# Patient Record
Sex: Male | Born: 2000 | Hispanic: Yes | Marital: Single | Smoking: Never smoker
Health system: Southern US, Community
[De-identification: ages and names within clinical notes are randomized; demographics above are authoritative.]

## PROBLEM LIST (undated history)

## (undated) ENCOUNTER — Ambulatory Visit: Admission: EM

---
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
## 2022-01-28 ENCOUNTER — Emergency Department
Admission: EM | Admit: 2022-01-28 | Discharge: 2022-01-28 | Disposition: A | Discharge status: Home / Self Care | Payer: Medicaid Other | Attending: Emergency Medicine | Admitting: Emergency Medicine

## 2022-01-28 ENCOUNTER — Other Ambulatory Visit: Payer: Self-pay

## 2022-01-28 ENCOUNTER — Emergency Department: Payer: Medicaid Other

## 2022-01-28 DIAGNOSIS — S6991XA Unspecified injury of right wrist, hand and finger(s), initial encounter: Secondary | ICD-10-CM | POA: Diagnosis not present

## 2022-01-28 DIAGNOSIS — R109 Unspecified abdominal pain: Secondary | ICD-10-CM | POA: Diagnosis not present

## 2022-01-28 DIAGNOSIS — W232XXA Caught, crushed, jammed or pinched between a moving and stationary object, initial encounter: Secondary | ICD-10-CM | POA: Diagnosis not present

## 2022-01-28 DIAGNOSIS — M79644 Pain in right finger(s): Secondary | ICD-10-CM | POA: Diagnosis present

## 2022-01-28 MED ORDER — MELOXICAM 15 MG PO TABS: 15.0000 mg | ORAL_TABLET | Freq: Every day | ORAL | 0 refills | Status: AC

## 2022-01-28 NOTE — ED Provider Notes (Signed)
[facility] Provider Note  Patient Contact: 9:34 PM (approximate)   History   Hand Injury   HPI  Philip Green is a 22 y.o. male who presents the emergency department complaining of abdominal pain.  Patient works at [employer], was putting a box on a conveyor belt when it bounced backwards striking his thumb.  Patient has been able to move the thumb appropriately, still continue about his daily activities.  Patient went back to work today and had an ongoing pain.  Patient presents for evaluation.  No open wounds.  No other injury or complaint.     Physical Exam   Triage Vital Signs: ED Triage Vitals  Enc Vitals Group     BP 01/28/22 2004 139/84     Pulse Rate 01/28/22 2004 79     Resp 01/28/22 2004 16     Temp 01/28/22 2004 98.6 F (37 C)     Temp Source 01/28/22 2004 Oral     SpO2 01/28/22 2004 100 %     Weight 01/28/22 2006 157 lb (71.2 kg)     Height 01/28/22 2006 5\' 10"  (1.778 m)     Head Circumference --      Peak Flow --      Pain Score 01/28/22 2006 3     Pain Loc --      Pain Edu? --      Excl. in [place]? --     Most recent vital signs: Vitals:   01/28/22 2004  BP: 139/84  Pulse: 79  Resp: 16  Temp: 98.6 F (37 C)  SpO2: 100%     General: Alert and in no acute distress.  Cardiovascular:  Good peripheral perfusion Respiratory: Normal respiratory effort without tachypnea or retractions. Lungs CTAB.  Musculoskeletal: Full range of motion to all extremities.  Visualization of the right hand reveals no open wounds.  Patient has good range of motion to the thumb.  Tender especially over the MCP joint.  No other palpable abnormality or tenderness.  Sensation capillary refill intact. Neurologic:  No gross focal neurologic deficits are appreciated.  Skin:   No rash noted Other:   ED Results / Procedures / Treatments   Labs (all labs ordered are listed, but only abnormal results are displayed) Labs Reviewed - No data to  display   EKG     RADIOLOGY  I personally viewed, evaluated, and interpreted these images as part of my medical decision making, as well as reviewing the written report by the radiologist.  ED Provider Interpretation: No acute traumatic finding on x-ray  DG Hand Complete Right  Result Date: 01/28/2022 CLINICAL DATA:  Status post trauma. EXAM: RIGHT HAND - COMPLETE 3+ VIEW COMPARISON:  None Available. FINDINGS: There is no evidence of fracture or dislocation. There is no evidence of arthropathy or other focal bone abnormality. Soft tissues are unremarkable. IMPRESSION: Negative. Electronically Signed   By: Virgina Norfolk M.D.   On: 01/28/2022 20:45    PROCEDURES:  Critical Care performed: No  Procedures   MEDICATIONS ORDERED IN ED: Medications - No data to display   IMPRESSION / MDM / [place] / ED COURSE  I reviewed the triage vital signs and the nursing notes.                                 Differential diagnosis includes, but is not limited to, contusion, jammed interphalangeal  digit, ligamentous rupture, de Quervain's tenosynovitis, fracture   Patient's presentation is most consistent with acute presentation with potential threat to life or bodily function.   Patient's diagnosis is consistent with jammed interphalangeal digit.  Patient presents emergency department complaining of thumb pain and bruising.  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  This made contact with his hand.  Patient still has full range of motion.  Negative x-ray.  Patient will have anti-inflammatories for symptom relief..  Patient is given ED precautions to return to the ED for any worsening or new symptoms.     FINAL CLINICAL IMPRESSION(S) / ED DIAGNOSES   Final diagnoses:  Jammed interphalangeal joint of finger of right hand, initial encounter     Rx / DC Orders   ED Discharge Orders          Ordered     meloxicam (MOBIC) 15 MG tablet  Daily        01/28/22 2154             Note:  This document was prepared using Dragon voice recognition software and may include unintentional dictation errors.   Brynda Peon 01/28/22 2154    Naaman Plummer, MD 01/28/22 [phone]

## 2022-01-28 NOTE — ED Triage Notes (Addendum)
Pt comes from home via POV c/o hand injury. Pt states yesterday he was working and putting boxes on conveyer belt when box bounced up and jammed thumb. Pt has bruising to palm. Pt in NAD at this time. Pt able to move hand and thumb

## 2022-09-16 ENCOUNTER — Encounter: Payer: Self-pay | Admitting: Emergency Medicine

## 2022-09-16 ENCOUNTER — Emergency Department
Admission: EM | Admit: 2022-09-16 | Discharge: 2022-09-16 | Disposition: A | Discharge status: Home / Self Care | Payer: Medicaid Other | Attending: Emergency Medicine | Admitting: Emergency Medicine

## 2022-09-16 ENCOUNTER — Other Ambulatory Visit: Payer: Self-pay

## 2022-09-16 DIAGNOSIS — U071 COVID-19: Secondary | ICD-10-CM | POA: Diagnosis not present

## 2022-09-16 DIAGNOSIS — I159 Secondary hypertension, unspecified: Secondary | ICD-10-CM | POA: Insufficient documentation

## 2022-09-16 DIAGNOSIS — I10 Essential (primary) hypertension: Secondary | ICD-10-CM | POA: Diagnosis not present

## 2022-09-16 DIAGNOSIS — R509 Fever, unspecified: Secondary | ICD-10-CM | POA: Diagnosis present

## 2022-09-16 LAB — SARS CORONAVIRUS 2 BY RT PCR: SARS Coronavirus 2 by RT PCR: POSITIVE — AB

## 2022-09-16 MED ORDER — ONDANSETRON 4 MG PO TBDP: 4.0000 mg | ORAL_TABLET | Freq: Three times a day (TID) | ORAL | 0 refills | Status: AC | PRN

## 2022-09-16 NOTE — ED Notes (Signed)
See triage notes. Patient c/o high blood pressure, fever and headache.

## 2022-09-16 NOTE — ED Triage Notes (Signed)
Pt here with fever, head ache and hypertension. Pt's significant other tested positive for covid a few days ago. Pt also having mild joint pain.

## 2022-09-16 NOTE — ED Provider Notes (Signed)
[facility] Provider Note    Event Date/Time   First MD Initiated Contact with Patient 09/16/22 1538     (approximate)   History   Fever and Hypertension   HPI  Philip Green is a 22 y.o. male presents to the emergency department for not feeling well.  States that has not been feeling well for the past couple of days.  Fever, headache and a mild cough.  Girlfriend tested positive for COVID.  Mild body aches.  Denies any significant chest pain or shortness of breath.  Also states that he had elevated blood pressures that he was told was elevated when he was 17 and that he needed to follow it up however he has not done so.  Inquiring about a primary care provider.  Denies any numbness or weakness in extremities.  Does endorse some mild nausea.  No episodes of vomiting or abdominal pain.  No change in vision.     Physical Exam   Triage Vital Signs: ED Triage Vitals  Encounter Vitals Group     BP 09/16/22 1443 (!) 148/79     Systolic BP Percentile --      Diastolic BP Percentile --      Pulse Rate 09/16/22 1443 74     Resp 09/16/22 1443 16     Temp 09/16/22 1443 99.4 F (37.4 C)     Temp Source 09/16/22 1443 Oral     SpO2 09/16/22 1443 98 %     Weight 09/16/22 1448 156 lb 15.5 oz (71.2 kg)     Height 09/16/22 1448 5\' 10"  (1.778 m)     Head Circumference --      Peak Flow --      Pain Score 09/16/22 1448 7     Pain Loc --      Pain Education --      Exclude from Growth Chart --     Most recent vital signs: Vitals:   09/16/22 1443  BP: (!) 148/79  Pulse: 74  Resp: 16  Temp: 99.4 F (37.4 C)  SpO2: 98%    Physical Exam Constitutional:      Appearance: He is well-developed.  HENT:     Head: Atraumatic.  Eyes:     Conjunctiva/sclera: Conjunctivae normal.  Cardiovascular:     Rate and Rhythm: Regular rhythm.  Pulmonary:     Effort: No respiratory distress.  Musculoskeletal:     Cervical back: Normal range of motion.  Skin:     General: Skin is warm.  Neurological:     Mental Status: He is alert. Mental status is at baseline.      IMPRESSION / MDM / ASSESSMENT AND PLAN / ED COURSE  I reviewed the triage vital signs and the nursing notes.  Differential diagnosis including hypertension, COVID, pneumonia, viral illness primary headache    Labs (all labs ordered are listed, but only abnormal results are displayed) Labs interpreted as -    Labs Reviewed  SARS CORONAVIRUS 2 BY RT PCR - Abnormal; Notable for the following components:      Result Value   SARS Coronavirus 2 by RT PCR POSITIVE (*)    All other components within normal limits    COVID testing is positive.  Low suspicion for meningitis, has a nonfocal neurologic exam and no meningismus on exam.  No signs of respiratory distress.  Low suspicion for endorgan damage with hypertension, blood pressure is only elevated to 140.  Discussed at  length the need to follow-up with primary care provider, given a referral and information to establish care.  Given a prescription for antiemetics and discussed symptomatic treatment with Motrin and Tylenol.  Given return precautions for any worsening symptoms.     PROCEDURES:  Critical Care performed: No  Procedures  Patient's presentation is most consistent with acute complicated illness / injury requiring diagnostic workup.   MEDICATIONS ORDERED IN ED: Medications - No data to display  FINAL CLINICAL IMPRESSION(S) / ED DIAGNOSES   Final diagnoses:  Secondary hypertension  COVID     Rx / DC Orders   ED Discharge Orders          Ordered    ondansetron (ZOFRAN-ODT) 4 MG disintegrating tablet  Every 8 hours PRN        09/16/22 1559    Ambulatory Referral to Primary Care (Establish Care)        09/16/22 1600             Note:  This document was prepared using Dragon voice recognition software and may include unintentional dictation errors.   Corena Herter, MD 09/16/22 [phone]

## 2022-09-16 NOTE — Discharge Instructions (Addendum)
You are seen in the emergency department and tested positive for COVID.  You also had an elevated blood pressure.  You were given a referral for primary care provider.  You are also given information for [facility] primary care.  If you develop any chest pain or shortness of breath return to the emergency department.  Pain control:  Ibuprofen (motrin/aleve/advil) - You can take 3 tablets (600 mg) every 6 hours as needed for pain/fever.  Acetaminophen (tylenol) - You can take 2 extra strength tablets (1000 mg) every 6 hours as needed for pain/fever.  You can alternate these medications or take them together.  Make sure you eat food/drink water when taking these medications.  zofran (ondansetron) - nausea medication, take 1 tablet every 8 hours as needed for nausea/vomiting.

## 2023-02-24 DIAGNOSIS — H5213 Myopia, bilateral: Secondary | ICD-10-CM | POA: Diagnosis not present

## 2023-08-25 ENCOUNTER — Ambulatory Visit

## 2023-08-25 NOTE — Progress Notes (Deleted)
    New patient visit   Patient: Philip Green   DOB: [birth year]   23 y.o. Male  MRN: [id number] Visit Date: 08/25/2023  Today's healthcare provider: Isaiah DELENA Pepper, MD   No chief complaint on file.  Subjective    Philip Green is a 23 y.o. male who presents today as a new patient to establish care.   Discussed the use of AI scribe software for clinical note transcription with the patient, who gave verbal consent to proceed.  History of Present Illness             No past medical history on file. No past surgical history on file. No family status information on file.   No family history on file. Social History   Socioeconomic History   Marital status: Single    Spouse name: Not on file   Number of children: Not on file   Years of education: Not on file   Highest education level: Not on file  Occupational History   Not on file  Tobacco Use   Smoking status: Never   Smokeless tobacco: Never  Substance and Sexual Activity   Alcohol use: Never   Drug use: Never   Sexual activity: Yes  Other Topics Concern   Not on file  Social History Narrative   Not on file   Social Drivers of Health   Financial Resource Strain: Not on file  Food Insecurity: Not on file  Transportation Needs: Not on file  Physical Activity: Not on file  Stress: Not on file  Social Connections: Not on file   Outpatient Medications Prior to Visit  Medication Sig   ondansetron  (ZOFRAN -ODT) 4 MG disintegrating tablet Take 1 tablet (4 mg total) by mouth every 8 (eight) hours as needed for nausea or vomiting.   polyethylene glycol (MIRALAX  / GLYCOLAX ) packet Take 17 g by mouth daily. Mix one tablespoon with 8oz of your favorite juice or water every day until you are having soft formed stools. Then start taking once daily if you didn't have a stool the day before.   No facility-administered medications prior to visit.   No Known Allergies  Reviews of Systems as noted in  HPI.  {Insert previous labs (optional):23779} {See past labs  Heme  Chem  Endocrine  Serology  Results Review (optional):1}   Objective    There were no vitals taken for this visit. {Insert last BP/Wt (optional):23777}{See vitals history (optional):1}   Physical Exam  Depression Screen     No data to display         No results found for any visits on 08/25/23.  Assessment & Plan      Problem List Items Addressed This Visit   None   Assessment and Plan              No follow-ups on file.      Isaiah DELENA Pepper, MD  [facility] [phone] (phone) [phone] (fax)
